# Patient Record
Sex: Female | Born: 1975
Health system: Southern US, Community
[De-identification: ages and names within clinical notes are randomized; demographics above are authoritative.]

---

## 2013-07-27 ENCOUNTER — Institutional Professional Consult (permissible substitution): Payer: Self-pay | Admitting: Sports Medicine

## 2013-08-01 ENCOUNTER — Institutional Professional Consult (permissible substitution): Payer: Self-pay | Admitting: Sports Medicine

## 2013-08-18 ENCOUNTER — Institutional Professional Consult (permissible substitution): Payer: Self-pay | Admitting: Sports Medicine

## 2015-01-18 ENCOUNTER — Telehealth: Payer: Self-pay

## 2015-01-18 NOTE — Telephone Encounter (Signed)
Opened in error

## 2015-06-19 ENCOUNTER — Ambulatory Visit (INDEPENDENT_AMBULATORY_CARE_PROVIDER_SITE_OTHER): Payer: BLUE CROSS/BLUE SHIELD | Admitting: Sports Medicine

## 2015-06-19 VITALS — BP 109/71 | HR 94 | Resp 18 | Wt 131.7 lb

## 2015-06-19 DIAGNOSIS — M5412 Radiculopathy, cervical region: Secondary | ICD-10-CM | POA: Diagnosis not present

## 2015-06-19 MED ORDER — ETODOLAC ER 600 MG PO TB24: 600.0000 mg | ORAL_TABLET | Freq: Every day | ORAL | Status: DC

## 2015-06-19 MED ORDER — PREDNISONE 50 MG PO TABS: ORAL_TABLET | ORAL | Status: DC

## 2015-06-19 MED ORDER — GABAPENTIN 300 MG PO CAPS: ORAL_CAPSULE | ORAL | Status: DC

## 2015-06-19 MED ORDER — MELOXICAM 15 MG PO TABS: ORAL_TABLET | ORAL | Status: DC

## 2015-06-19 NOTE — Progress Notes (Signed)
   Subjective:    I'm seeing this patient as a consultation for:  Dr. Tomma RakersBruce Lantelme  CC: Neck and shoulder pain  HPI: This is a pleasant 40 year old female, she comes in with a long history of neck, and upper shoulder pain, with radiation of pain to the fourth and fifth fingers. Symptoms are moderate, persistent, she has had a couple of MRI that shows small disc protrusions, has had formal physical therapy and epidural injections, the epidural injection with some time ago and she cannot remember whether it helped or not. No bowel or bladder dysfunction, no saddle numbness, minimal shoulder pain.  Past medical history, Surgical history, Family history not pertinant except as noted below, Social history, Allergies, and medications have been entered into the medical record, reviewed, and no changes needed.   Review of Systems: No headache, visual changes, nausea, vomiting, diarrhea, constipation, dizziness, abdominal pain, skin rash, fevers, chills, night sweats, weight loss, swollen lymph nodes, body aches, joint swelling, muscle aches, chest pain, shortness of breath, mood changes, visual or auditory hallucinations.   Objective:   General: Well Developed, well nourished, and in no acute distress.  Neuro/Psych: Alert and oriented x3, extra-ocular muscles intact, able to move all 4 extremities, sensation grossly intact. Skin: Warm and dry, no rashes noted.  Respiratory: Not using accessory muscles, speaking in full sentences, trachea midline.  Cardiovascular: Pulses palpable, no extremity edema. Abdomen: Does not appear distended. Right Shoulder: Inspection reveals no abnormalities, atrophy or asymmetry. Palpation is normal with no tenderness over AC joint or bicipital groove. ROM is full in all planes, there is some pain with passive range of motion. Rotator cuff strength normal throughout. Weakly positive Neer and Hawkin's tests, empty can. Speeds and Yergason's tests normal. No labral  pathology noted with negative Obrien's, negative crank, negative clunk, and good stability. Positive cross arm sign Normal scapular function observed. No painful arc and no drop arm sign. No apprehension sign Neck: Negative spurling's Full neck range of motion Grip strength and sensation normal in bilateral hands Strength good C4 to T1 distribution No sensory change to C4 to T1 Reflexes normal  Impression and Recommendations:   This case required medical decision making of moderate complexity.

## 2015-06-19 NOTE — Assessment & Plan Note (Signed)
MRI [facility] shows a small C5-C6 protrusion with indentation on the thecal sac, no clear central canal or foraminal stenosis. Has already been through physical therapy, epidural steroid injection, unsure of how she responded. Has not yet been on neuropathic agents. She is going to get me the MRI on disc, and we are going to start a steady up taper of gabapentin, also adding meloxicam. She is fairly painful with any movement of her shoulder, and I am concerned there is an element of myofascial pain syndrome as well.  Return to see me in one month.

## 2015-06-20 ENCOUNTER — Telehealth: Payer: Self-pay

## 2015-06-20 NOTE — Telephone Encounter (Signed)
It has only been a matter of hours since I have seen her, she needs to take all the prescribed medicines and give it at least a week before we change the plan, surgery would result in more pain initially, and an epidural wouldn't start working for 4-7 days anyway, so give the prescribed medicines a chance to work, we also discussed that she would need to drop off her MRI on a disc for me to review before considering injection.  Also, how has her mood been?

## 2015-06-20 NOTE — Telephone Encounter (Signed)
Michelle DikeJennifer called and states she is doing worse today. She wants to know if she could get the epidural or be referred to someone for surgery. Please advise.

## 2015-06-21 NOTE — Telephone Encounter (Signed)
Patient advised of recommendations.  

## 2015-06-27 ENCOUNTER — Ambulatory Visit (INDEPENDENT_AMBULATORY_CARE_PROVIDER_SITE_OTHER): Payer: BLUE CROSS/BLUE SHIELD | Admitting: Sports Medicine

## 2015-06-27 DIAGNOSIS — M5412 Radiculopathy, cervical region: Secondary | ICD-10-CM | POA: Diagnosis not present

## 2015-06-27 MED ORDER — TRAMADOL HCL 50 MG PO TABS: ORAL_TABLET | ORAL | Status: DC

## 2015-06-27 NOTE — Progress Notes (Signed)
  Subjective:    CC: follow-up  HPI: This is a pleasant 40 year old female with right C6 type radiculopathy, she has had a couple of cervical epidurals without tremendous response, more recently we added prednisone, gabapentin, Lodine. Unfortunately she has not responded much.  She did bring her MRI for review. She also has a nerve conduction study done at [facility] that does confirm a right C6 radiculopathy, as well as some evidence of a median neuropathy at the wrist. Patient has no wrist symptoms, all neck pain.  Past medical history, Surgical history, Family history not pertinant except as noted below, Social history, Allergies, and medications have been entered into the medical record, reviewed, and no changes needed.   Review of Systems: No fevers, chills, night sweats, weight loss, chest pain, or shortness of breath.   Objective:    General: Well Developed, well nourished, and in no acute distress.  Neuro: Alert and oriented x3, extra-ocular muscles intact, sensation grossly intact.  HEENT: Normocephalic, atraumatic, pupils equal round reactive to light, neck supple, no masses, no lymphadenopathy, thyroid nonpalpable.  Skin: Warm and dry, no rashes. Cardiac: Regular rate and rhythm, no murmurs rubs or gallops, no lower extremity edema.  Respiratory: Clear to auscultation bilaterally. Not using accessory muscles, speaking in full sentences.  Cervical spine MRI shows a non-desiccated disc protrusion at the C5-C6 level, it does come close to the thecal sac, and there is a mild right foraminal component, it doesn't clearly contact the C6 nerve root.  Impression and Recommendations:

## 2015-06-27 NOTE — Assessment & Plan Note (Signed)
Nerve conduction study confirms right C6 radiculopathy, there is some element of median neuropathy at the wrist but there are no wrist symptoms, predominant pain is in the neck. MRI personally reviewed and shows a C5-C6 disc protrusion that is fairly small, but has the potential for intermittent right C5-C6 foraminal stenosis. Currently on gabapentin 300 mg twice a day, continue up taper. We are going to proceed with another cervical epidural, I would recommend that we do epidurals #2 and 3 approximately one month apart before considering surgical intervention.  Tramadol for pain, and I would like her to also get a second opinion from our spine surgeon.

## 2015-07-02 ENCOUNTER — Encounter: Payer: Self-pay | Admitting: Sports Medicine

## 2015-07-08 ENCOUNTER — Ambulatory Visit: Payer: BLUE CROSS/BLUE SHIELD | Admitting: Sports Medicine

## 2015-12-18 ENCOUNTER — Ambulatory Visit (INDEPENDENT_AMBULATORY_CARE_PROVIDER_SITE_OTHER): Payer: BLUE CROSS/BLUE SHIELD

## 2015-12-18 ENCOUNTER — Ambulatory Visit (INDEPENDENT_AMBULATORY_CARE_PROVIDER_SITE_OTHER): Payer: BLUE CROSS/BLUE SHIELD | Admitting: Sports Medicine

## 2015-12-18 DIAGNOSIS — M5416 Radiculopathy, lumbar region: Secondary | ICD-10-CM

## 2015-12-18 DIAGNOSIS — M79604 Pain in right leg: Secondary | ICD-10-CM | POA: Diagnosis not present

## 2015-12-18 MED ORDER — PREDNISONE 50 MG PO TABS: ORAL_TABLET | ORAL | 0 refills | Status: DC

## 2015-12-18 NOTE — Assessment & Plan Note (Signed)
Prednisone, formal physical therapy, x-rays.  Return to see me in one month, MRI for interventional planning if no better.

## 2015-12-18 NOTE — Progress Notes (Signed)
   Subjective:    I'm seeing this patient as a consultation for:  Dr. Vickki MuffWeston Saunders/Dr. Tomma RakersBruce Lantelme  CC: Right leg pain  HPI: This is a pleasant 40 year old female with a history of cervical radiculopathy, improved with epidurals. Unfortunately she's had about 8 weeks of increasing pain running down her right posterior thigh, posterior calf to the outside of her ankle, but not to the toes. No positions make it worse, pain is moderate, persistent, no bowel or bladder dysfunction, saddle numbness, no constitutional symptoms.  Past medical history:  Negative.  See flowsheet/record as well for more information.  Surgical history: Negative.  See flowsheet/record as well for more information.  Family history: Negative.  See flowsheet/record as well for more information.  Social history: Negative.  See flowsheet/record as well for more information.  Allergies, and medications have been entered into the medical record, reviewed, and no changes needed.   Review of Systems: No headache, visual changes, nausea, vomiting, diarrhea, constipation, dizziness, abdominal pain, skin rash, fevers, chills, night sweats, weight loss, swollen lymph nodes, body aches, joint swelling, muscle aches, chest pain, shortness of breath, mood changes, visual or auditory hallucinations.   Objective:   General: Well Developed, well nourished, and in no acute distress.  Neuro/Psych: Alert and oriented x3, extra-ocular muscles intact, able to move all 4 extremities, sensation grossly intact. Skin: Warm and dry, no rashes noted.  Respiratory: Not using accessory muscles, speaking in full sentences, trachea midline.  Cardiovascular: Pulses palpable, no extremity edema. Abdomen: Does not appear distended. Back Exam:  Inspection: Unremarkable  Motion: Flexion 45 deg, Extension 45 deg, Side Bending to 45 deg bilaterally,  Rotation to 45 deg bilaterally  SLR laying: Negative  XSLR laying: Negative  Palpable tenderness:  None. FABER: negative. Sensory change: Gross sensation intact to all lumbar and sacral dermatomes.  Reflexes: 2+ at both patellar tendons, 2+ at achilles tendons, Babinski's downgoing.  Strength at foot  Plantar-flexion: 5/5 Dorsi-flexion: 5/5 Eversion: 5/5 Inversion: 5/5  Leg strength  Quad: 5/5 Hamstring: 5/5 Hip flexor: 5/5 Hip abductors: 5/5  Gait unremarkable.  Impression and Recommendations:   This case required medical decision making of moderate complexity.  Right lumbar radiculopathy Prednisone, formal physical therapy, x-rays.  Return to see me in one month, MRI for interventional planning if no better.

## 2016-01-01 DIAGNOSIS — E559 Vitamin D deficiency, unspecified: Secondary | ICD-10-CM | POA: Diagnosis not present

## 2016-01-01 DIAGNOSIS — R51 Headache: Secondary | ICD-10-CM | POA: Diagnosis not present

## 2016-01-01 DIAGNOSIS — A6920 Lyme disease, unspecified: Secondary | ICD-10-CM | POA: Diagnosis not present

## 2016-01-01 DIAGNOSIS — R29898 Other symptoms and signs involving the musculoskeletal system: Secondary | ICD-10-CM | POA: Diagnosis not present

## 2016-01-15 ENCOUNTER — Other Ambulatory Visit: Payer: Self-pay | Admitting: Sports Medicine

## 2016-01-15 DIAGNOSIS — M5412 Radiculopathy, cervical region: Secondary | ICD-10-CM

## 2016-01-16 DIAGNOSIS — R29898 Other symptoms and signs involving the musculoskeletal system: Secondary | ICD-10-CM | POA: Diagnosis not present

## 2016-01-16 DIAGNOSIS — A6920 Lyme disease, unspecified: Secondary | ICD-10-CM | POA: Diagnosis not present

## 2016-01-16 DIAGNOSIS — R5383 Other fatigue: Secondary | ICD-10-CM | POA: Diagnosis not present

## 2016-01-16 DIAGNOSIS — R51 Headache: Secondary | ICD-10-CM | POA: Diagnosis not present

## 2016-01-22 DIAGNOSIS — M4600 Spinal enthesopathy, site unspecified: Secondary | ICD-10-CM | POA: Diagnosis not present

## 2016-01-22 DIAGNOSIS — M999 Biomechanical lesion, unspecified: Secondary | ICD-10-CM | POA: Diagnosis not present

## 2016-01-29 DIAGNOSIS — M4600 Spinal enthesopathy, site unspecified: Secondary | ICD-10-CM | POA: Diagnosis not present

## 2016-01-29 DIAGNOSIS — M999 Biomechanical lesion, unspecified: Secondary | ICD-10-CM | POA: Diagnosis not present

## 2016-01-29 DIAGNOSIS — H524 Presbyopia: Secondary | ICD-10-CM | POA: Diagnosis not present

## 2016-02-04 DIAGNOSIS — M4600 Spinal enthesopathy, site unspecified: Secondary | ICD-10-CM | POA: Diagnosis not present

## 2016-02-04 DIAGNOSIS — M9902 Segmental and somatic dysfunction of thoracic region: Secondary | ICD-10-CM | POA: Diagnosis not present

## 2016-02-04 DIAGNOSIS — M999 Biomechanical lesion, unspecified: Secondary | ICD-10-CM | POA: Diagnosis not present

## 2016-02-28 DIAGNOSIS — R922 Inconclusive mammogram: Secondary | ICD-10-CM | POA: Diagnosis not present

## 2016-02-28 DIAGNOSIS — Z9189 Other specified personal risk factors, not elsewhere classified: Secondary | ICD-10-CM | POA: Diagnosis not present

## 2016-02-28 DIAGNOSIS — Z1371 Encounter for nonprocreative screening for genetic disease carrier status: Secondary | ICD-10-CM | POA: Diagnosis not present

## 2016-02-28 DIAGNOSIS — Z1231 Encounter for screening mammogram for malignant neoplasm of breast: Secondary | ICD-10-CM | POA: Diagnosis not present

## 2016-03-11 DIAGNOSIS — Z01411 Encounter for gynecological examination (general) (routine) with abnormal findings: Secondary | ICD-10-CM | POA: Diagnosis not present

## 2016-03-11 DIAGNOSIS — R635 Abnormal weight gain: Secondary | ICD-10-CM | POA: Diagnosis not present

## 2016-03-11 DIAGNOSIS — N951 Menopausal and female climacteric states: Secondary | ICD-10-CM | POA: Diagnosis not present

## 2016-03-30 DIAGNOSIS — M549 Dorsalgia, unspecified: Secondary | ICD-10-CM | POA: Diagnosis not present

## 2016-03-30 DIAGNOSIS — R3915 Urgency of urination: Secondary | ICD-10-CM | POA: Diagnosis not present

## 2016-04-15 DIAGNOSIS — G8190 Hemiplegia, unspecified affecting unspecified side: Secondary | ICD-10-CM | POA: Diagnosis not present

## 2016-04-15 DIAGNOSIS — A6920 Lyme disease, unspecified: Secondary | ICD-10-CM | POA: Diagnosis not present

## 2016-04-15 DIAGNOSIS — E559 Vitamin D deficiency, unspecified: Secondary | ICD-10-CM | POA: Diagnosis not present

## 2016-04-15 DIAGNOSIS — G8929 Other chronic pain: Secondary | ICD-10-CM | POA: Diagnosis not present

## 2016-04-27 DIAGNOSIS — A6920 Lyme disease, unspecified: Secondary | ICD-10-CM | POA: Diagnosis not present

## 2016-04-27 DIAGNOSIS — E2749 Other adrenocortical insufficiency: Secondary | ICD-10-CM | POA: Diagnosis not present

## 2016-04-27 DIAGNOSIS — R7982 Elevated C-reactive protein (CRP): Secondary | ICD-10-CM | POA: Diagnosis not present

## 2016-04-27 DIAGNOSIS — R5382 Chronic fatigue, unspecified: Secondary | ICD-10-CM | POA: Diagnosis not present

## 2016-05-15 DIAGNOSIS — G8929 Other chronic pain: Secondary | ICD-10-CM | POA: Diagnosis not present

## 2016-05-15 DIAGNOSIS — M797 Fibromyalgia: Secondary | ICD-10-CM | POA: Diagnosis not present

## 2016-06-17 DIAGNOSIS — G894 Chronic pain syndrome: Secondary | ICD-10-CM | POA: Diagnosis not present

## 2016-06-17 DIAGNOSIS — E2749 Other adrenocortical insufficiency: Secondary | ICD-10-CM | POA: Diagnosis not present

## 2016-06-17 DIAGNOSIS — E7212 Methylenetetrahydrofolate reductase deficiency: Secondary | ICD-10-CM | POA: Diagnosis not present

## 2016-07-02 DIAGNOSIS — Z77120 Contact with and (suspected) exposure to mold (toxic): Secondary | ICD-10-CM | POA: Diagnosis not present

## 2016-07-02 DIAGNOSIS — G8929 Other chronic pain: Secondary | ICD-10-CM | POA: Diagnosis not present

## 2016-07-02 DIAGNOSIS — E7212 Methylenetetrahydrofolate reductase deficiency: Secondary | ICD-10-CM | POA: Diagnosis not present

## 2016-07-02 DIAGNOSIS — G629 Polyneuropathy, unspecified: Secondary | ICD-10-CM | POA: Diagnosis not present

## 2016-07-29 DIAGNOSIS — G8190 Hemiplegia, unspecified affecting unspecified side: Secondary | ICD-10-CM | POA: Diagnosis not present

## 2016-07-29 DIAGNOSIS — Z77120 Contact with and (suspected) exposure to mold (toxic): Secondary | ICD-10-CM | POA: Diagnosis not present

## 2016-09-01 DIAGNOSIS — R922 Inconclusive mammogram: Secondary | ICD-10-CM | POA: Diagnosis not present

## 2016-09-01 DIAGNOSIS — Z9189 Other specified personal risk factors, not elsewhere classified: Secondary | ICD-10-CM | POA: Diagnosis not present

## 2016-09-01 DIAGNOSIS — Z1371 Encounter for nonprocreative screening for genetic disease carrier status: Secondary | ICD-10-CM | POA: Diagnosis not present

## 2016-09-01 DIAGNOSIS — E8940 Asymptomatic postprocedural ovarian failure: Secondary | ICD-10-CM | POA: Diagnosis not present

## 2016-09-03 DIAGNOSIS — Z77120 Contact with and (suspected) exposure to mold (toxic): Secondary | ICD-10-CM | POA: Diagnosis not present

## 2016-09-03 DIAGNOSIS — G894 Chronic pain syndrome: Secondary | ICD-10-CM | POA: Diagnosis not present

## 2016-11-04 DIAGNOSIS — Z803 Family history of malignant neoplasm of breast: Secondary | ICD-10-CM | POA: Diagnosis not present

## 2016-11-04 DIAGNOSIS — N6012 Diffuse cystic mastopathy of left breast: Secondary | ICD-10-CM | POA: Diagnosis not present

## 2016-11-04 DIAGNOSIS — E8940 Asymptomatic postprocedural ovarian failure: Secondary | ICD-10-CM | POA: Diagnosis not present

## 2016-11-04 DIAGNOSIS — R922 Inconclusive mammogram: Secondary | ICD-10-CM | POA: Diagnosis not present

## 2016-11-04 DIAGNOSIS — Z90722 Acquired absence of ovaries, bilateral: Secondary | ICD-10-CM | POA: Diagnosis not present

## 2016-11-04 DIAGNOSIS — N6011 Diffuse cystic mastopathy of right breast: Secondary | ICD-10-CM | POA: Diagnosis not present

## 2016-12-03 DIAGNOSIS — G8190 Hemiplegia, unspecified affecting unspecified side: Secondary | ICD-10-CM | POA: Diagnosis not present

## 2016-12-03 DIAGNOSIS — Z77120 Contact with and (suspected) exposure to mold (toxic): Secondary | ICD-10-CM | POA: Diagnosis not present

## 2016-12-03 DIAGNOSIS — R5383 Other fatigue: Secondary | ICD-10-CM | POA: Diagnosis not present

## 2016-12-03 DIAGNOSIS — A6920 Lyme disease, unspecified: Secondary | ICD-10-CM | POA: Diagnosis not present

## 2017-01-11 DIAGNOSIS — A6920 Lyme disease, unspecified: Secondary | ICD-10-CM | POA: Diagnosis not present

## 2017-01-11 DIAGNOSIS — G894 Chronic pain syndrome: Secondary | ICD-10-CM | POA: Diagnosis not present

## 2017-01-11 DIAGNOSIS — R5381 Other malaise: Secondary | ICD-10-CM | POA: Diagnosis not present

## 2017-01-11 DIAGNOSIS — G8190 Hemiplegia, unspecified affecting unspecified side: Secondary | ICD-10-CM | POA: Diagnosis not present

## 2017-05-03 DIAGNOSIS — Z1231 Encounter for screening mammogram for malignant neoplasm of breast: Secondary | ICD-10-CM | POA: Diagnosis not present

## 2017-05-04 DIAGNOSIS — Z77120 Contact with and (suspected) exposure to mold (toxic): Secondary | ICD-10-CM | POA: Diagnosis not present

## 2017-05-04 DIAGNOSIS — N951 Menopausal and female climacteric states: Secondary | ICD-10-CM | POA: Diagnosis not present

## 2017-05-04 DIAGNOSIS — A6920 Lyme disease, unspecified: Secondary | ICD-10-CM | POA: Diagnosis not present

## 2017-05-04 DIAGNOSIS — G8190 Hemiplegia, unspecified affecting unspecified side: Secondary | ICD-10-CM | POA: Diagnosis not present

## 2017-05-11 DIAGNOSIS — Z1371 Encounter for nonprocreative screening for genetic disease carrier status: Secondary | ICD-10-CM | POA: Diagnosis not present

## 2017-05-11 DIAGNOSIS — R928 Other abnormal and inconclusive findings on diagnostic imaging of breast: Secondary | ICD-10-CM | POA: Diagnosis not present

## 2017-05-11 DIAGNOSIS — N6012 Diffuse cystic mastopathy of left breast: Secondary | ICD-10-CM | POA: Diagnosis not present

## 2017-05-11 DIAGNOSIS — Z9189 Other specified personal risk factors, not elsewhere classified: Secondary | ICD-10-CM | POA: Diagnosis not present

## 2017-05-11 DIAGNOSIS — R922 Inconclusive mammogram: Secondary | ICD-10-CM | POA: Diagnosis not present

## 2017-05-18 DIAGNOSIS — H5112 Convergence excess: Secondary | ICD-10-CM | POA: Diagnosis not present

## 2017-05-27 ENCOUNTER — Encounter: Payer: Self-pay | Admitting: Sports Medicine

## 2017-05-27 ENCOUNTER — Ambulatory Visit (INDEPENDENT_AMBULATORY_CARE_PROVIDER_SITE_OTHER): Payer: BLUE CROSS/BLUE SHIELD | Admitting: Sports Medicine

## 2017-05-27 DIAGNOSIS — M7711 Lateral epicondylitis, right elbow: Secondary | ICD-10-CM

## 2017-05-27 NOTE — Progress Notes (Signed)
Subjective:    I'm seeing this patient as a consultation for: Dr. Tomma Rakers  CC: Right elbow pain  HPI: This is a pleasant 42 year old female, for the past several months she has had increasing pain over the lateral aspect of her right elbow, severe, persistent, worse with gripping, opening doors, shaking hands, localized over the common extensor tendon origin with radiation over to the dorsal mid forearm.  She is also having some right arm radicular and right leg radicular symptoms that she would like to keep on the back burner for now.  We did order a cervical epidural, she never proceeded with this, we also had discussed her lumbar radiculopathy in 2018 but she never followed up.  I reviewed the past medical history, family history, social history, surgical history, and allergies today and no changes were needed.  Please see the problem list section below in epic for further details.  Past Medical History: History reviewed. No pertinent past medical history. Past Surgical History: History reviewed. No pertinent surgical history. Social History: Social History   Socioeconomic History  . Marital status: Married    Spouse name: None  . Number of children: None  . Years of education: None  . Highest education level: None  Social Needs  . Financial resource strain: None  . Food insecurity - worry: None  . Food insecurity - inability: None  . Transportation needs - medical: None  . Transportation needs - non-medical: None  Occupational History  . None  Tobacco Use  . Smoking status: Never Smoker  . Smokeless tobacco: Never Used  Substance and Sexual Activity  . Alcohol use: None  . Drug use: None  . Sexual activity: None  Other Topics Concern  . None  Social History Narrative  . None   Family History: History reviewed. No pertinent family history. Allergies: No Known Allergies Medications: See med rec.  Review of Systems: No headache, visual changes, nausea,  vomiting, diarrhea, constipation, dizziness, abdominal pain, skin rash, fevers, chills, night sweats, weight loss, swollen lymph nodes, body aches, joint swelling, muscle aches, chest pain, shortness of breath, mood changes, visual or auditory hallucinations.   Objective:   General: Well Developed, well nourished, and in no acute distress.  Neuro:  Extra-ocular muscles intact, able to move all 4 extremities, sensation grossly intact.  Deep tendon reflexes tested were normal. Psych: Alert and oriented, mood congruent with affect. ENT:  Ears and nose appear unremarkable.  Hearing grossly normal. Neck: Unremarkable overall appearance, trachea midline.  No visible thyroid enlargement. Eyes: Conjunctivae and lids appear unremarkable.  Pupils equal and round. Skin: Warm and dry, no rashes noted.  Cardiovascular: Pulses palpable, no extremity edema. Right elbow: Unremarkable to inspection. Range of motion full pronation, supination, flexion, extension. Strength is full to all of the above directions Stable to varus, valgus stress. Negative moving valgus stress test. Tender to palpation of the common extensor tendon origin Ulnar nerve does not sublux. Negative cubital tunnel Tinel's.  Procedure: Real-time Ultrasound Guided Injection of right common extensor tendon origin Device: GE Logiq E  Verbal informed consent obtained.  Time-out conducted.  Noted no overlying erythema, induration, or other signs of local infection.  Skin prepped in a sterile fashion.  Local anesthesia: Topical Ethyl chloride.  With sterile technique and under real time ultrasound guidance: Noted hypoechoic gap in the deep origin of the common extensor tendon at the lateral epicondyle, I advanced a 25-gauge needle and injected medication both superficial to, deep to the extensor  tendon, I placed a touch of medication into the hypoechoic gap as well, a total of 1 cc Kenalog 40, 1 cc lidocaine, 1 cc bupivacaine. Completed  without difficulty  Pain immediately resolved suggesting accurate placement of the medication.  Advised to call if fevers/chills, erythema, induration, drainage, or persistent bleeding.  Images permanently stored and available for review in the ultrasound unit.  Impression: Technically successful ultrasound guided injection.  Impression and Recommendations:   This case required medical decision making of moderate complexity.  Lateral epicondylitis, right elbow Per patient request injection as above. Patient will obtain tennis elbow brace on her own. Rehab exercises given, we are putting her back and neck on the back burner for now. Return in 1 month to evaluate response. ___________________________________________ Ihor Austinhomas J. Benjamin Stainhekkekandam, M.D., ABFM., CAQSM. Primary Care and Sports Medicine [facility]  Adjunct Instructor of Family Medicine  [employer]

## 2017-05-27 NOTE — Assessment & Plan Note (Signed)
Per patient request injection as above. Patient will obtain tennis elbow brace on her own. Rehab exercises given, we are putting her back and neck on the back burner for now. Return in 1 month to evaluate response.

## 2017-06-04 DIAGNOSIS — H518 Other specified disorders of binocular movement: Secondary | ICD-10-CM | POA: Diagnosis not present

## 2017-06-04 DIAGNOSIS — R200 Anesthesia of skin: Secondary | ICD-10-CM | POA: Diagnosis not present

## 2017-06-04 DIAGNOSIS — R51 Headache: Secondary | ICD-10-CM | POA: Diagnosis not present

## 2017-06-04 DIAGNOSIS — R202 Paresthesia of skin: Secondary | ICD-10-CM | POA: Diagnosis not present

## 2017-06-08 DIAGNOSIS — R42 Dizziness and giddiness: Secondary | ICD-10-CM | POA: Diagnosis not present

## 2017-06-08 DIAGNOSIS — H5021 Vertical strabismus, right eye: Secondary | ICD-10-CM | POA: Diagnosis not present

## 2017-06-11 DIAGNOSIS — R51 Headache: Secondary | ICD-10-CM | POA: Diagnosis not present

## 2017-06-11 DIAGNOSIS — H538 Other visual disturbances: Secondary | ICD-10-CM | POA: Diagnosis not present

## 2017-06-21 DIAGNOSIS — R200 Anesthesia of skin: Secondary | ICD-10-CM | POA: Diagnosis not present

## 2017-06-21 DIAGNOSIS — R51 Headache: Secondary | ICD-10-CM | POA: Diagnosis not present

## 2017-06-21 DIAGNOSIS — R202 Paresthesia of skin: Secondary | ICD-10-CM | POA: Diagnosis not present

## 2017-08-11 DIAGNOSIS — R51 Headache: Secondary | ICD-10-CM | POA: Diagnosis not present

## 2017-09-02 DIAGNOSIS — Z9189 Other specified personal risk factors, not elsewhere classified: Secondary | ICD-10-CM | POA: Diagnosis not present

## 2017-09-02 DIAGNOSIS — E8940 Asymptomatic postprocedural ovarian failure: Secondary | ICD-10-CM | POA: Diagnosis not present

## 2017-09-02 DIAGNOSIS — Z1371 Encounter for nonprocreative screening for genetic disease carrier status: Secondary | ICD-10-CM | POA: Diagnosis not present

## 2017-09-02 DIAGNOSIS — R922 Inconclusive mammogram: Secondary | ICD-10-CM | POA: Diagnosis not present

## 2017-09-06 DIAGNOSIS — G4700 Insomnia, unspecified: Secondary | ICD-10-CM | POA: Diagnosis not present

## 2017-09-06 DIAGNOSIS — Z9189 Other specified personal risk factors, not elsewhere classified: Secondary | ICD-10-CM | POA: Diagnosis not present

## 2017-09-06 DIAGNOSIS — F4322 Adjustment disorder with anxiety: Secondary | ICD-10-CM | POA: Diagnosis not present

## 2017-09-06 DIAGNOSIS — R232 Flushing: Secondary | ICD-10-CM | POA: Diagnosis not present

## 2017-09-13 DIAGNOSIS — M8588 Other specified disorders of bone density and structure, other site: Secondary | ICD-10-CM | POA: Diagnosis not present

## 2017-09-13 DIAGNOSIS — E2839 Other primary ovarian failure: Secondary | ICD-10-CM | POA: Diagnosis not present

## 2017-09-13 DIAGNOSIS — E8940 Asymptomatic postprocedural ovarian failure: Secondary | ICD-10-CM | POA: Diagnosis not present

## 2017-09-13 DIAGNOSIS — M8589 Other specified disorders of bone density and structure, multiple sites: Secondary | ICD-10-CM | POA: Diagnosis not present

## 2017-09-13 DIAGNOSIS — M85851 Other specified disorders of bone density and structure, right thigh: Secondary | ICD-10-CM | POA: Diagnosis not present

## 2017-09-30 ENCOUNTER — Ambulatory Visit: Payer: BLUE CROSS/BLUE SHIELD | Admitting: Sports Medicine

## 2017-09-30 ENCOUNTER — Encounter: Payer: Self-pay | Admitting: Sports Medicine

## 2017-09-30 DIAGNOSIS — M7711 Lateral epicondylitis, right elbow: Secondary | ICD-10-CM

## 2017-09-30 DIAGNOSIS — M2550 Pain in unspecified joint: Secondary | ICD-10-CM | POA: Diagnosis not present

## 2017-09-30 NOTE — Assessment & Plan Note (Signed)
Right common extensor tendon injection 4 months ago, now just recently having a recurrence of symptoms. Did not get any efficacy with a counterforce brace. Repeat injection today with ultrasound guidance, she will be very diligent with her rehab exercises. If recurrence of symptoms before 3 to 4 months we will proceed with a PRP percutaneous tenotomy.

## 2017-09-30 NOTE — Assessment & Plan Note (Addendum)
Patient did notice a good improvement in widespread polyarthralgias and myalgias after the steroid injection from the systemic effect. We are going to do a full rheumatoid work-up. CBC, ESR, CMP, rheumatoid panel, lupus panel, uric acid, CK levels. She has noticed improvement in some of her disconjugate gaze symptoms and joint aches with nortriptyline but has noted weight gain as is common with tricyclic antidepressants.

## 2017-09-30 NOTE — Progress Notes (Signed)
Subjective:    CC: Right elbow pain  HPI: Michelle Lewis is a pleasant 42 year old female, 4 months ago we did a common extensor tendon injection on her right elbow, she had good relief until recently.  She is back with pain over the lateral epicondyles on the right, moderate, persistent without radiation, worse with gripping motions.  She has seen a neurologist in the meantime for disconjugate gaze, was referred to neuro ophthalmology, has had a full work-up including lumbar puncture with no abnormal bands, brain MRI.  She did report that her multiple polyarthralgias and myalgias were resolved for a short period of time after the steroid injection.  She has not yet had a full rheumatoid work-up.  I reviewed the past medical history, family history, social history, surgical history, and allergies today and no changes were needed.  Please see the problem list section below in epic for further details.  Past Medical History: No past medical history on file. Past Surgical History: No past surgical history on file. Social History: Social History   Socioeconomic History  . Marital status: Married    Spouse name: Not on file  . Number of children: Not on file  . Years of education: Not on file  . Highest education level: Not on file  Occupational History  . Not on file  Social Needs  . Financial resource strain: Not on file  . Food insecurity:    Worry: Not on file    Inability: Not on file  . Transportation needs:    Medical: Not on file    Non-medical: Not on file  Tobacco Use  . Smoking status: Never Smoker  . Smokeless tobacco: Never Used  Substance and Sexual Activity  . Alcohol use: Not on file  . Drug use: Not on file  . Sexual activity: Not on file  Lifestyle  . Physical activity:    Days per week: Not on file    Minutes per session: Not on file  . Stress: Not on file  Relationships  . Social connections:    Talks on phone: Not on file    Gets together: Not on file   Attends religious service: Not on file    Active member of club or organization: Not on file    Attends meetings of clubs or organizations: Not on file    Relationship status: Not on file  Other Topics Concern  . Not on file  Social History Narrative  . Not on file   Family History: No family history on file. Allergies: No Known Allergies Medications: See med rec.  Review of Systems: No fevers, chills, night sweats, weight loss, chest pain, or shortness of breath.   Objective:    General: Well Developed, well nourished, and in no acute distress.  Neuro: Alert and oriented x3, extra-ocular muscles intact, sensation grossly intact.  HEENT: Normocephalic, atraumatic, pupils equal round reactive to light, neck supple, no masses, no lymphadenopathy, thyroid nonpalpable.  Skin: Warm and dry, no rashes. Cardiac: Regular rate and rhythm, no murmurs rubs or gallops, no lower extremity edema.  Respiratory: Clear to auscultation bilaterally. Not using accessory muscles, speaking in full sentences. Right elbow: Unremarkable to inspection. Range of motion full pronation, supination, flexion, extension. Strength is full to all of the above directions Stable to varus, valgus stress. Negative moving valgus stress test. Tender over the lateral epicondyle. Ulnar nerve does not sublux. Negative cubital tunnel Tinel's.  Procedure: Real-time Ultrasound Guided Injection of right common extensor tendon origin Device: GE  Logiq E  Verbal informed consent obtained.  Time-out conducted.  Noted no overlying erythema, induration, or other signs of local infection.  Skin prepped in a sterile fashion.  Local anesthesia: Topical Ethyl chloride.  With sterile technique and under real time ultrasound guidance: 1 cc, 40, 1 cc lidocaine, 1 cc bupivacaine injected easily Completed without difficulty  Pain immediately resolved suggesting accurate placement of the medication.  Advised to call if fevers/chills,  erythema, induration, drainage, or persistent bleeding.  Images permanently stored and available for review in the ultrasound unit.  Impression: Technically successful ultrasound guided injection.  Impression and Recommendations:    Lateral epicondylitis, right elbow Right common extensor tendon injection 4 months ago, now just recently having a recurrence of symptoms. Did not get any efficacy with a counterforce brace. Repeat injection today with ultrasound guidance, she will be very diligent with her rehab exercises. If recurrence of symptoms before 3 to 4 months we will proceed with a PRP percutaneous tenotomy.  Polyarthralgia Patient did notice a good improvement in widespread polyarthralgias and myalgias after the steroid injection from the systemic effect. We are going to do a full rheumatoid work-up. CBC, ESR, CMP, rheumatoid panel, lupus panel, uric acid, CK levels. She has noticed improvement in some of her disconjugate gaze symptoms and joint aches with nortriptyline but has noted weight gain as is common with tricyclic antidepressants. ___________________________________________ Gwen Her. Dianah Field, M.D., ABFM., CAQSM. Primary Care and [place] Instructor of [place] [employer] [facility] [employer]

## 2017-10-06 LAB — COMPREHENSIVE METABOLIC PANEL
AG Ratio: 1.9 (calc) (ref 1.0–2.5)
ALT: 28 U/L (ref 6–29)
AST: 23 U/L (ref 10–30)
Alkaline phosphatase (APISO): 67 U/L (ref 33–115)
BUN: 17 mg/dL (ref 7–25)
Calcium: 9.8 mg/dL (ref 8.6–10.2)
Total Bilirubin: 0.5 mg/dL (ref 0.2–1.2)

## 2017-10-06 LAB — COMPREHENSIVE METABOLIC PANEL WITH GFR
Albumin: 4.6 g/dL (ref 3.6–5.1)
CO2: 30 mmol/L (ref 20–32)
Chloride: 105 mmol/L (ref 98–110)
Creat: 0.71 mg/dL (ref 0.50–1.10)
Globulin: 2.4 g/dL (ref 1.9–3.7)
Glucose, Bld: 69 mg/dL (ref 65–139)
Potassium: 4.2 mmol/L (ref 3.5–5.3)
Sodium: 143 mmol/L (ref 135–146)
Total Protein: 7 g/dL (ref 6.1–8.1)

## 2017-10-06 LAB — LUPUS(12) PANEL
Anti Nuclear Antibody(ANA): NEGATIVE
C3 Complement: 131 mg/dL (ref 83–193)
C4 Complement: 28 mg/dL (ref 15–57)
ENA SM Ab Ser-aCnc: <1 AI
Rheumatoid fact SerPl-aCnc: <14 [IU]/mL (ref <14)
Ribosomal P Protein Ab: <1 AI
SM/RNP: <1 AI
SSA (Ro) (ENA) Antibody, IgG: <1 AI
SSB (La) (ENA) Antibody, IgG: <1 AI
Scleroderma (Scl-70) (ENA) Antibody, IgG: <1 AI
Thyroperoxidase Ab SerPl-aCnc: 1 IU/mL (ref <9)
ds DNA Ab: 1 [IU]/mL

## 2017-10-06 LAB — CBC WITH DIFFERENTIAL/PLATELET
Basophils Absolute: 102 cells/uL (ref 0–200)
Basophils Relative: 1.7 %
Eosinophils Absolute: 330 {cells}/uL (ref 15–500)
Eosinophils Relative: 5.5 %
HCT: 42.9 % (ref 35.0–45.0)
Hemoglobin: 13.9 g/dL (ref 11.7–15.5)
Lymphs Abs: 1464 cells/uL (ref 850–3900)
MCH: 29.4 pg (ref 27.0–33.0)
MCHC: 32.4 g/dL (ref 32.0–36.0)
MCV: 90.7 fL (ref 80.0–100.0)
MPV: 10.7 fL (ref 7.5–12.5)
Monocytes Relative: 9.2 %
Neutro Abs: 3552 cells/uL (ref 1500–7800)
Neutrophils Relative %: 59.2 %
Platelets: 277 Thousand/uL (ref 140–400)
RBC: 4.73 Million/uL (ref 3.80–5.10)
RDW: 12.7 % (ref 11.0–15.0)
Total Lymphocyte: 24.4 %
WBC mixed population: 552 cells/uL (ref 200–950)
WBC: 6 Thousand/uL (ref 3.8–10.8)

## 2017-10-06 LAB — RHEUMATOID FACTOR (IGA, IGG, IGM)
Rheumatoid Factor (IgA): <5 U (ref <=6)
Rheumatoid Factor (IgG): <5 U (ref <=6)
Rheumatoid Factor (IgM): <5 U (ref <=6)

## 2017-10-06 LAB — SEDIMENTATION RATE: Sed Rate: 6 mm/h (ref 0–20)

## 2017-10-06 LAB — URIC ACID: Uric Acid, Serum: 5 mg/dL (ref 2.5–7.0)

## 2017-10-06 LAB — CK: Total CK: 87 U/L (ref 29–143)

## 2017-10-06 LAB — CYCLIC CITRUL PEPTIDE ANTIBODY, IGG: Cyclic Citrullin Peptide Ab: <16 U

## 2017-10-13 DIAGNOSIS — Z79899 Other long term (current) drug therapy: Secondary | ICD-10-CM | POA: Diagnosis not present

## 2017-10-13 DIAGNOSIS — Z780 Asymptomatic menopausal state: Secondary | ICD-10-CM | POA: Diagnosis not present

## 2017-10-13 DIAGNOSIS — E28319 Asymptomatic premature menopause: Secondary | ICD-10-CM | POA: Diagnosis not present

## 2017-10-13 DIAGNOSIS — M8589 Other specified disorders of bone density and structure, multiple sites: Secondary | ICD-10-CM | POA: Diagnosis not present

## 2017-10-13 DIAGNOSIS — Z1321 Encounter for screening for nutritional disorder: Secondary | ICD-10-CM | POA: Diagnosis not present

## 2017-11-05 DIAGNOSIS — R928 Other abnormal and inconclusive findings on diagnostic imaging of breast: Secondary | ICD-10-CM | POA: Diagnosis not present

## 2017-11-05 DIAGNOSIS — N6002 Solitary cyst of left breast: Secondary | ICD-10-CM | POA: Diagnosis not present

## 2017-11-05 DIAGNOSIS — Z9189 Other specified personal risk factors, not elsewhere classified: Secondary | ICD-10-CM | POA: Diagnosis not present

## 2017-11-05 DIAGNOSIS — R922 Inconclusive mammogram: Secondary | ICD-10-CM | POA: Diagnosis not present

## 2017-12-14 ENCOUNTER — Ambulatory Visit: Payer: BLUE CROSS/BLUE SHIELD | Admitting: Sports Medicine

## 2017-12-14 DIAGNOSIS — M7711 Lateral epicondylitis, right elbow: Secondary | ICD-10-CM | POA: Diagnosis not present

## 2017-12-14 MED ORDER — DIAZEPAM 5 MG PO TABS: ORAL_TABLET | ORAL | 0 refills | Status: DC

## 2017-12-14 MED ORDER — HYDROCODONE-ACETAMINOPHEN 5-325 MG PO TABS: 1.0000 | ORAL_TABLET | Freq: Three times a day (TID) | ORAL | 0 refills | Status: DC | PRN

## 2017-12-14 NOTE — Progress Notes (Signed)
Subjective:    CC: Elbow pain  HPI: Michelle Lewis is a pleasant 42 year old female, she has had chronic right lateral epicondylitis, she responded well to initial steroid injection but subsequent injection done 2 months ago did not provide but a couple of weeks of relief.  She has been diligent with her exercises, pain is moderate, persistent, localized without radiation.  I reviewed the past medical history, family history, social history, surgical history, and allergies today and no changes were needed.  Please see the problem list section below in epic for further details.  Past Medical History: No past medical history on file. Past Surgical History: No past surgical history on file. Social History: Social History   Socioeconomic History  . Marital status: Married    Spouse name: Not on file  . Number of children: Not on file  . Years of education: Not on file  . Highest education level: Not on file  Occupational History  . Not on file  Social Needs  . Financial resource strain: Not on file  . Food insecurity:    Worry: Not on file    Inability: Not on file  . Transportation needs:    Medical: Not on file    Non-medical: Not on file  Tobacco Use  . Smoking status: Never Smoker  . Smokeless tobacco: Never Used  Substance and Sexual Activity  . Alcohol use: Not on file  . Drug use: Not on file  . Sexual activity: Not on file  Lifestyle  . Physical activity:    Days per week: Not on file    Minutes per session: Not on file  . Stress: Not on file  Relationships  . Social connections:    Talks on phone: Not on file    Gets together: Not on file    Attends religious service: Not on file    Active member of club or organization: Not on file    Attends meetings of clubs or organizations: Not on file    Relationship status: Not on file  Other Topics Concern  . Not on file  Social History Narrative  . Not on file   Family History: No family history on  file. Allergies: No Known Allergies Medications: See med rec.  Review of Systems: No fevers, chills, night sweats, weight loss, chest pain, or shortness of breath.   Objective:    General: Well Developed, well nourished, and in no acute distress.  Neuro: Alert and oriented x3, extra-ocular muscles intact, sensation grossly intact.  HEENT: Normocephalic, atraumatic, pupils equal round reactive to light, neck supple, no masses, no lymphadenopathy, thyroid nonpalpable.  Skin: Warm and dry, no rashes. Cardiac: Regular rate and rhythm, no murmurs rubs or gallops, no lower extremity edema.  Respiratory: Clear to auscultation bilaterally. Not using accessory muscles, speaking in full sentences. Right elbow: Unremarkable to inspection. Range of motion full pronation, supination, flexion, extension. Strength is full to all of the above directions Stable to varus, valgus stress. Negative moving valgus stress test. Tenderness at the common extensor tendon origin with reduction of pain with resisted extension of the middle finger. Ulnar nerve does not sublux. Negative cubital tunnel Tinel's.  Impression and Recommendations:    Lateral epicondylitis, right elbow At this point we have done to right common extensor tendon origin steroid injections, partial efficacy with the first 1, did not really get but a few weeks after the second injection 2 months ago. She has been doing her rehab exercises diligently. At this point we  are going to proceed with PRP percutaneous tenotomy. She will make an appointment to do PRP next week, adding some Valium for preprocedural anxiolysis, patient declines preoperative MRI. I discussed the mechanism of PRP injections.  ___________________________________________ Ihor Austinhomas J. Benjamin Stainhekkekandam, M.D., ABFM., CAQSM. Primary Care and Sports Medicine [facility]  Adjunct Instructor of Family Medicine  [employer]

## 2017-12-14 NOTE — Assessment & Plan Note (Signed)
At this point we have done to right common extensor tendon origin steroid injections, partial efficacy with the first 1, did not really get but a few weeks after the second injection 2 months ago. She has been doing her rehab exercises diligently. At this point we are going to proceed with PRP percutaneous tenotomy. She will make an appointment to do PRP next week, adding some Valium for preprocedural anxiolysis, patient declines preoperative MRI. I discussed the mechanism of PRP injections.

## 2017-12-20 ENCOUNTER — Ambulatory Visit: Payer: BLUE CROSS/BLUE SHIELD | Admitting: Sports Medicine

## 2017-12-20 DIAGNOSIS — M7711 Lateral epicondylitis, right elbow: Secondary | ICD-10-CM

## 2017-12-20 MED ORDER — HYDROCODONE-ACETAMINOPHEN 5-325 MG PO TABS: 1.0000 | ORAL_TABLET | Freq: Three times a day (TID) | ORAL | 0 refills | Status: DC | PRN

## 2017-12-20 NOTE — Progress Notes (Signed)
   Procedure: Real-time Ultrasound Guided Platelet Rich Plasma (PRP) Injection of right common extensor tendon origin Device: GE Logiq E  Verbal informed consent obtained.  Time-out conducted.  Noted no overlying erythema, induration, or other signs of local infection.  Obtained 30 cc of blood from peripheral vein, using the "PEAK" centrifuge, red blood cells were separated from the plasma. Subsequently red blood cells were drained leaving only plasma with the buffy coat layer between the desired lines. Platelet poor plasma was then centrifuged out, and remaining platelet rich plasma aspirated into a 5 cc syringe.  Skin prepped in a sterile fashion.  Local anesthesia: Topical Ethyl chloride.  With sterile technique and under real time ultrasound guidance the platelet rich plasma (PRP) obtained above: I made several passes through the common extensor tendon after injection of anesthetic, approximately 50 passes for a full percutaneous tenotomy with platelet rich plasma. Completed without difficulty  Advised to call if fevers/chills, erythema, induration, drainage, or persistent bleeding.  Images permanently stored and available for review in the ultrasound unit.  Impression: Technically successful ultrasound guided Platelet Rich Plasma (PRP) injection.  ___________________________________________________________________________________________  Lateral epicondylitis, right elbow After #2 right common extensor tendon origin steroid injections with partial efficacy with the first and only a few weeks with the second. Doing the rehab diligently. Percutaneous right PRP tenotomy today. Hydrocodone for postprocedural pain. Return to see me in 2 weeks for a postop check.

## 2017-12-20 NOTE — Assessment & Plan Note (Addendum)
After #2 right common extensor tendon origin steroid injections with partial efficacy with the first and only a few weeks with the second. Doing the rehab diligently. Percutaneous right PRP tenotomy today. Hydrocodone for postprocedural pain. Return to see me in 2 weeks for a postop check.

## 2018-01-03 ENCOUNTER — Encounter: Payer: Self-pay | Admitting: Sports Medicine

## 2018-01-03 ENCOUNTER — Ambulatory Visit (INDEPENDENT_AMBULATORY_CARE_PROVIDER_SITE_OTHER): Payer: BLUE CROSS/BLUE SHIELD | Admitting: Sports Medicine

## 2018-01-03 DIAGNOSIS — M7711 Lateral epicondylitis, right elbow: Secondary | ICD-10-CM

## 2018-01-03 MED ORDER — HYDROCODONE-ACETAMINOPHEN 5-325 MG PO TABS: 1.0000 | ORAL_TABLET | Freq: Three times a day (TID) | ORAL | 0 refills | Status: DC | PRN

## 2018-01-03 NOTE — Patient Instructions (Signed)
[facility] [id number], [place], [place] [address] [phone]

## 2018-01-03 NOTE — Progress Notes (Signed)
  Subjective: 2 weeks post PRP percutaneous tenotomy of the right common extensor tendon origin.  Was doing well, tried to catch a plate as it was falling and then had a recurrence of pain but overall improving.  Objective: General: Well-developed, well-nourished, and in no acute distress. Right elbow: No tenderness over the side of the common extensor tendon origin, 2 degrees of extension lag, flexion to almost 90 degrees.  Assessment/plan:   Lateral epicondylitis, right elbow Expected postoperative pain after an aggressive percutaneous tenotomy with PRP 2 weeks ago. Start physical therapy, 2 weeks of gentle protected range of motion, 2 weeks of active range of motion, followed by 4 weeks of strengthening. ___________________________________________ Ihor Austin. Benjamin Stain, M.D., ABFM., CAQSM. Primary Care and Sports Medicine [facility]  Adjunct Instructor of Family Medicine  [employer]

## 2018-01-03 NOTE — Assessment & Plan Note (Signed)
Expected postoperative pain after an aggressive percutaneous tenotomy with PRP 2 weeks ago. Start physical therapy, 2 weeks of gentle protected range of motion, 2 weeks of active range of motion, followed by 4 weeks of strengthening.

## 2018-01-10 DIAGNOSIS — M7711 Lateral epicondylitis, right elbow: Secondary | ICD-10-CM | POA: Diagnosis not present

## 2018-01-18 ENCOUNTER — Telehealth: Payer: Self-pay

## 2018-01-18 DIAGNOSIS — M7711 Lateral epicondylitis, right elbow: Secondary | ICD-10-CM

## 2018-01-18 MED ORDER — HYDROCODONE-ACETAMINOPHEN 5-325 MG PO TABS: 1.0000 | ORAL_TABLET | Freq: Three times a day (TID) | ORAL | 0 refills | Status: DC | PRN

## 2018-01-18 NOTE — Telephone Encounter (Signed)
Pt had PRP and wanting to know if she is okay to go ahead and take Advil or if she should get refill of pain medication Dr T was giving?  Please advise

## 2018-01-18 NOTE — Telephone Encounter (Signed)
I would prefer she avoid anti-inflammatories for now, I am going to refill the pain medication, she can break it in half.

## 2018-01-19 DIAGNOSIS — M7711 Lateral epicondylitis, right elbow: Secondary | ICD-10-CM | POA: Diagnosis not present

## 2018-01-19 NOTE — Telephone Encounter (Signed)
Pt advised.

## 2018-01-31 ENCOUNTER — Ambulatory Visit: Payer: BLUE CROSS/BLUE SHIELD | Admitting: Sports Medicine

## 2018-01-31 ENCOUNTER — Encounter: Payer: Self-pay | Admitting: Sports Medicine

## 2018-01-31 ENCOUNTER — Ambulatory Visit (INDEPENDENT_AMBULATORY_CARE_PROVIDER_SITE_OTHER): Payer: BLUE CROSS/BLUE SHIELD

## 2018-01-31 DIAGNOSIS — M7711 Lateral epicondylitis, right elbow: Secondary | ICD-10-CM

## 2018-01-31 DIAGNOSIS — M25521 Pain in right elbow: Secondary | ICD-10-CM | POA: Diagnosis not present

## 2018-01-31 MED ORDER — MELOXICAM 15 MG PO TABS: ORAL_TABLET | ORAL | 3 refills | Status: DC

## 2018-01-31 NOTE — Assessment & Plan Note (Signed)
PRP percutaneous tenotomy was 6 weeks ago, persistent pain, albeit intermittent. She has been doing her therapy, wearing her tennis elbow brace. I am going to switch from ibuprofen to meloxicam, she can continue to use hydrocodone as needed. X-ray, MRI, and I would like a surgical opinion from Dr. Everardo Pacific.

## 2018-01-31 NOTE — Progress Notes (Signed)
  Subjective:    CC: Right elbow  HPI: Michelle Lewis returns, she is now 6 weeks post aggressive percutaneous tenotomy of the common extensor tendon origin with PRP injection.  She continues to have intermittent pain, no longer consistent.  She has been doing her therapy, she is wearing her tennis elbow brace.  Somewhat frustrated by lack of response.  I reviewed the past medical history, family history, social history, surgical history, and allergies today and no changes were needed.  Please see the problem list section below in epic for further details.  Past Medical History: No past medical history on file. Past Surgical History: No past surgical history on file. Social History: Social History   Socioeconomic History  . Marital status: Married    Spouse name: Not on file  . Number of children: Not on file  . Years of education: Not on file  . Highest education level: Not on file  Occupational History  . Not on file  Social Needs  . Financial resource strain: Not on file  . Food insecurity:    Worry: Not on file    Inability: Not on file  . Transportation needs:    Medical: Not on file    Non-medical: Not on file  Tobacco Use  . Smoking status: Never Smoker  . Smokeless tobacco: Never Used  Substance and Sexual Activity  . Alcohol use: Not on file  . Drug use: Not on file  . Sexual activity: Not on file  Lifestyle  . Physical activity:    Days per week: Not on file    Minutes per session: Not on file  . Stress: Not on file  Relationships  . Social connections:    Talks on phone: Not on file    Gets together: Not on file    Attends religious service: Not on file    Active member of club or organization: Not on file    Attends meetings of clubs or organizations: Not on file    Relationship status: Not on file  Other Topics Concern  . Not on file  Social History Narrative  . Not on file   Family History: No family history on file. Allergies: No Known  Allergies Medications: See med rec.  Review of Systems: No fevers, chills, night sweats, weight loss, chest pain, or shortness of breath.   Objective:    General: Well Developed, well nourished, and in no acute distress.  Neuro: Alert and oriented x3, extra-ocular muscles intact, sensation grossly intact.  HEENT: Normocephalic, atraumatic, pupils equal round reactive to light, neck supple, no masses, no lymphadenopathy, thyroid nonpalpable.  Skin: Warm and dry, no rashes. Cardiac: Regular rate and rhythm, no murmurs rubs or gallops, no lower extremity edema.  Respiratory: Clear to auscultation bilaterally. Not using accessory muscles, speaking in full sentences.  Impression and Recommendations:    Lateral epicondylitis, right elbow PRP percutaneous tenotomy was 6 weeks ago, persistent pain, albeit intermittent. She has been doing her therapy, wearing her tennis elbow brace. I am going to switch from ibuprofen to meloxicam, she can continue to use hydrocodone as needed. X-ray, MRI, and I would like a surgical opinion from Dr. Everardo Pacific. ___________________________________________ Ihor Austin. Benjamin Stain, M.D., ABFM., CAQSM. Primary Care and Sports Medicine [facility]  Adjunct Professor of Family Medicine  [employer]

## 2018-02-01 DIAGNOSIS — R51 Headache: Secondary | ICD-10-CM | POA: Diagnosis not present

## 2018-02-02 ENCOUNTER — Encounter: Payer: Self-pay | Admitting: Sports Medicine

## 2018-02-02 DIAGNOSIS — M85621 Other cyst of bone, right upper arm: Secondary | ICD-10-CM | POA: Diagnosis not present

## 2018-02-02 DIAGNOSIS — S56511A Strain of other extensor muscle, fascia and tendon at forearm level, right arm, initial encounter: Secondary | ICD-10-CM | POA: Diagnosis not present

## 2018-02-02 DIAGNOSIS — M25421 Effusion, right elbow: Secondary | ICD-10-CM | POA: Diagnosis not present

## 2018-02-02 DIAGNOSIS — S53431A Radial collateral ligament sprain of right elbow, initial encounter: Secondary | ICD-10-CM | POA: Diagnosis not present

## 2018-02-02 DIAGNOSIS — S46811A Strain of other muscles, fascia and tendons at shoulder and upper arm level, right arm, initial encounter: Secondary | ICD-10-CM | POA: Diagnosis not present

## 2018-02-03 DIAGNOSIS — M25522 Pain in left elbow: Secondary | ICD-10-CM | POA: Diagnosis not present

## 2018-02-08 ENCOUNTER — Other Ambulatory Visit: Payer: Self-pay | Admitting: Sports Medicine

## 2018-02-08 DIAGNOSIS — M7711 Lateral epicondylitis, right elbow: Secondary | ICD-10-CM

## 2018-02-08 MED ORDER — HYDROCODONE-ACETAMINOPHEN 5-325 MG PO TABS: 1.0000 | ORAL_TABLET | Freq: Three times a day (TID) | ORAL | 0 refills | Status: DC | PRN

## 2018-02-21 DIAGNOSIS — M7711 Lateral epicondylitis, right elbow: Secondary | ICD-10-CM | POA: Diagnosis not present

## 2018-03-01 DIAGNOSIS — M7711 Lateral epicondylitis, right elbow: Secondary | ICD-10-CM | POA: Diagnosis not present

## 2018-03-22 DIAGNOSIS — Z9889 Other specified postprocedural states: Secondary | ICD-10-CM | POA: Diagnosis not present

## 2018-03-22 DIAGNOSIS — M7711 Lateral epicondylitis, right elbow: Secondary | ICD-10-CM | POA: Diagnosis not present

## 2018-04-06 DIAGNOSIS — Z9889 Other specified postprocedural states: Secondary | ICD-10-CM | POA: Diagnosis not present

## 2018-04-06 DIAGNOSIS — M7711 Lateral epicondylitis, right elbow: Secondary | ICD-10-CM | POA: Diagnosis not present

## 2018-04-22 DIAGNOSIS — Z9889 Other specified postprocedural states: Secondary | ICD-10-CM | POA: Diagnosis not present

## 2018-04-22 DIAGNOSIS — M7711 Lateral epicondylitis, right elbow: Secondary | ICD-10-CM | POA: Diagnosis not present

## 2018-05-02 DIAGNOSIS — Z9889 Other specified postprocedural states: Secondary | ICD-10-CM | POA: Diagnosis not present

## 2018-05-02 DIAGNOSIS — M7711 Lateral epicondylitis, right elbow: Secondary | ICD-10-CM | POA: Diagnosis not present

## 2018-05-06 DIAGNOSIS — Z1231 Encounter for screening mammogram for malignant neoplasm of breast: Secondary | ICD-10-CM | POA: Diagnosis not present

## 2018-05-18 DIAGNOSIS — E538 Deficiency of other specified B group vitamins: Secondary | ICD-10-CM | POA: Diagnosis not present

## 2018-05-18 DIAGNOSIS — Z0001 Encounter for general adult medical examination with abnormal findings: Secondary | ICD-10-CM | POA: Diagnosis not present

## 2018-05-18 DIAGNOSIS — E039 Hypothyroidism, unspecified: Secondary | ICD-10-CM | POA: Diagnosis not present

## 2018-05-18 DIAGNOSIS — N951 Menopausal and female climacteric states: Secondary | ICD-10-CM | POA: Diagnosis not present

## 2018-05-18 DIAGNOSIS — E559 Vitamin D deficiency, unspecified: Secondary | ICD-10-CM | POA: Diagnosis not present

## 2018-06-01 DIAGNOSIS — Z9889 Other specified postprocedural states: Secondary | ICD-10-CM | POA: Diagnosis not present

## 2018-06-01 DIAGNOSIS — M7711 Lateral epicondylitis, right elbow: Secondary | ICD-10-CM | POA: Diagnosis not present

## 2018-06-22 ENCOUNTER — Encounter: Payer: Self-pay | Admitting: Sports Medicine

## 2018-06-22 ENCOUNTER — Ambulatory Visit: Payer: BLUE CROSS/BLUE SHIELD | Admitting: Sports Medicine

## 2018-06-22 ENCOUNTER — Other Ambulatory Visit: Payer: Self-pay

## 2018-06-22 DIAGNOSIS — M7521 Bicipital tendinitis, right shoulder: Secondary | ICD-10-CM

## 2018-06-22 DIAGNOSIS — M7711 Lateral epicondylitis, right elbow: Secondary | ICD-10-CM | POA: Diagnosis not present

## 2018-06-22 NOTE — Assessment & Plan Note (Signed)
Good response to common extensor tendon repair, with Dr. Everardo Pacific. She is for the most part asymptomatic right now with regards to her tennis elbow.

## 2018-06-22 NOTE — Assessment & Plan Note (Signed)
Doing well with regards to her common extensor tendon origin and tennis elbow. Pain today is referrable to the distal biceps, adding rehab exercises, injection today with ultrasound guidance. Return to see me in a month.

## 2018-06-22 NOTE — Progress Notes (Signed)
Subjective:    CC:  Right elbow pain  HPI: Michelle Lewis is a pleasant 43 year old female, we treated her extensively for lateral epicondylitis, ending up with PRP injection that failed, she ultimately had operative intervention with Dr. Everardo Pacific back in December, overall did well.  Unfortunately she now has increasing pain in her elbow, and is worried that she has hurt her common extensor tendon again.  Pain is somewhat posterior to the lateral epicondyle.  Worse with flexion and gripping motions.  I reviewed the past medical history, family history, social history, surgical history, and allergies today and no changes were needed.  Please see the problem list section below in epic for further details.  Past Medical History: No past medical history on file. Past Surgical History: No past surgical history on file. Social History: Social History   Socioeconomic History  . Marital status: Married    Spouse name: Not on file  . Number of children: Not on file  . Years of education: Not on file  . Highest education level: Not on file  Occupational History  . Not on file  Social Needs  . Financial resource strain: Not on file  . Food insecurity:    Worry: Not on file    Inability: Not on file  . Transportation needs:    Medical: Not on file    Non-medical: Not on file  Tobacco Use  . Smoking status: Never Smoker  . Smokeless tobacco: Never Used  Substance and Sexual Activity  . Alcohol use: Not on file  . Drug use: Not on file  . Sexual activity: Not on file  Lifestyle  . Physical activity:    Days per week: Not on file    Minutes per session: Not on file  . Stress: Not on file  Relationships  . Social connections:    Talks on phone: Not on file    Gets together: Not on file    Attends religious service: Not on file    Active member of club or organization: Not on file    Attends meetings of clubs or organizations: Not on file    Relationship status: Not on file  Other Topics  Concern  . Not on file  Social History Narrative  . Not on file   Family History: No family history on file. Allergies: No Known Allergies Medications: See med rec.  Review of Systems: No fevers, chills, night sweats, weight loss, chest pain, or shortness of breath.   Objective:    General: Well Developed, well nourished, and in no acute distress.  Neuro: Alert and oriented x3, extra-ocular muscles intact, sensation grossly intact.  HEENT: Normocephalic, atraumatic, pupils equal round reactive to light, neck supple, no masses, no lymphadenopathy, thyroid nonpalpable.  Skin: Warm and dry, no rashes. Cardiac: Regular rate and rhythm, no murmurs rubs or gallops, no lower extremity edema.  Respiratory: Clear to auscultation bilaterally. Not using accessory muscles, speaking in full sentences. Right elbow: Unremarkable to inspection. Range of motion full pronation, supination, flexion, extension. Strength is full to all of the above directions Stable to varus, valgus stress. Negative moving valgus stress test. Specifically no tenderness at the common extensor tendon origin, she did have reproduction of pain with resisted supination of the forearm. Ulnar nerve does not sublux. Negative cubital tunnel Tinel's.  Procedure: Real-time Ultrasound Guided injection of the distal biceps insertion of the right Device: GE Logiq E  Verbal informed consent obtained.  Time-out conducted.  Noted no overlying erythema, induration, or  other signs of local infection.  Skin prepped in a sterile fashion.  Local anesthesia: Topical Ethyl chloride.  With sterile technique and under real time ultrasound guidance:  I placed before of information, I was able to see the radial tuberosity and the biceps tendon distally at its insertion, I guided the needle to the location and injected medication around the distal biceps tendon with a total of 1 cc Kenalog 40, 1 cc lidocaine. Completed without difficulty  Pain  immediately resolved suggesting accurate placement of the medication.  Advised to call if fevers/chills, erythema, induration, drainage, or persistent bleeding.  Images permanently stored and available for review in the ultrasound unit.  Impression: Technically successful ultrasound guided injection.  Impression and Recommendations:    Distal biceps tendinitis on right Doing well with regards to her common extensor tendon origin and tennis elbow. Pain today is referrable to the distal biceps, adding rehab exercises, injection today with ultrasound guidance. Return to see me in a month.  Lateral epicondylitis, right elbow Good response to common extensor tendon repair, with Dr. Everardo Pacific. She is for the most part asymptomatic right now with regards to her tennis elbow.   ___________________________________________ Ihor Austin. Benjamin Stain, M.D., ABFM., CAQSM. Primary Care and Sports Medicine [facility]  Adjunct Professor of Family Medicine  [employer]

## 2018-07-25 ENCOUNTER — Telehealth: Payer: BLUE CROSS/BLUE SHIELD | Admitting: Sports Medicine

## 2018-08-15 IMAGING — DX DG LUMBAR SPINE COMPLETE 4+V
5 series · 5 of 5 positions shown · non-contrast
Comparison: None in PACs

CLINICAL DATA: Right leg pain for the past 7 months with no known
injury. No low back pain

EXAM:
LUMBAR SPINE - COMPLETE 4+ VIEW

[l-spine ap]
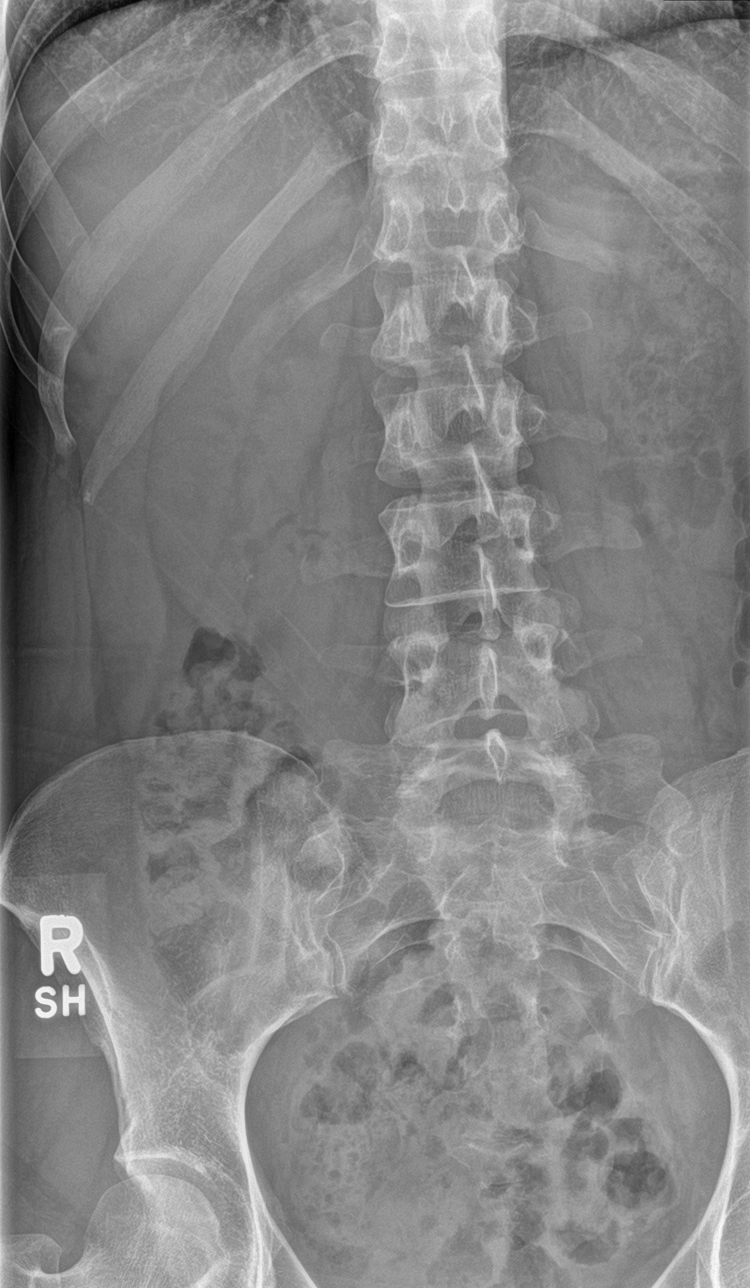

[l-spine obl (1 of 2)]
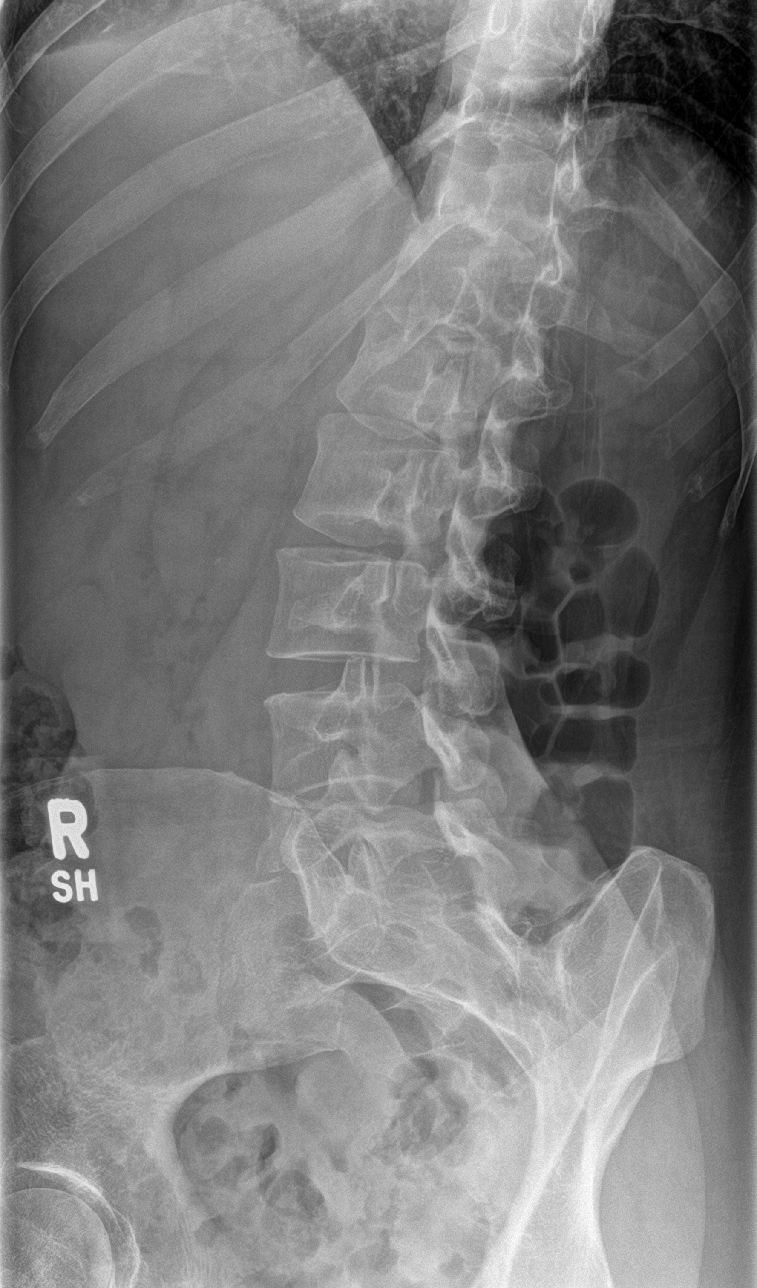

[l-spine obl (2 of 2)]
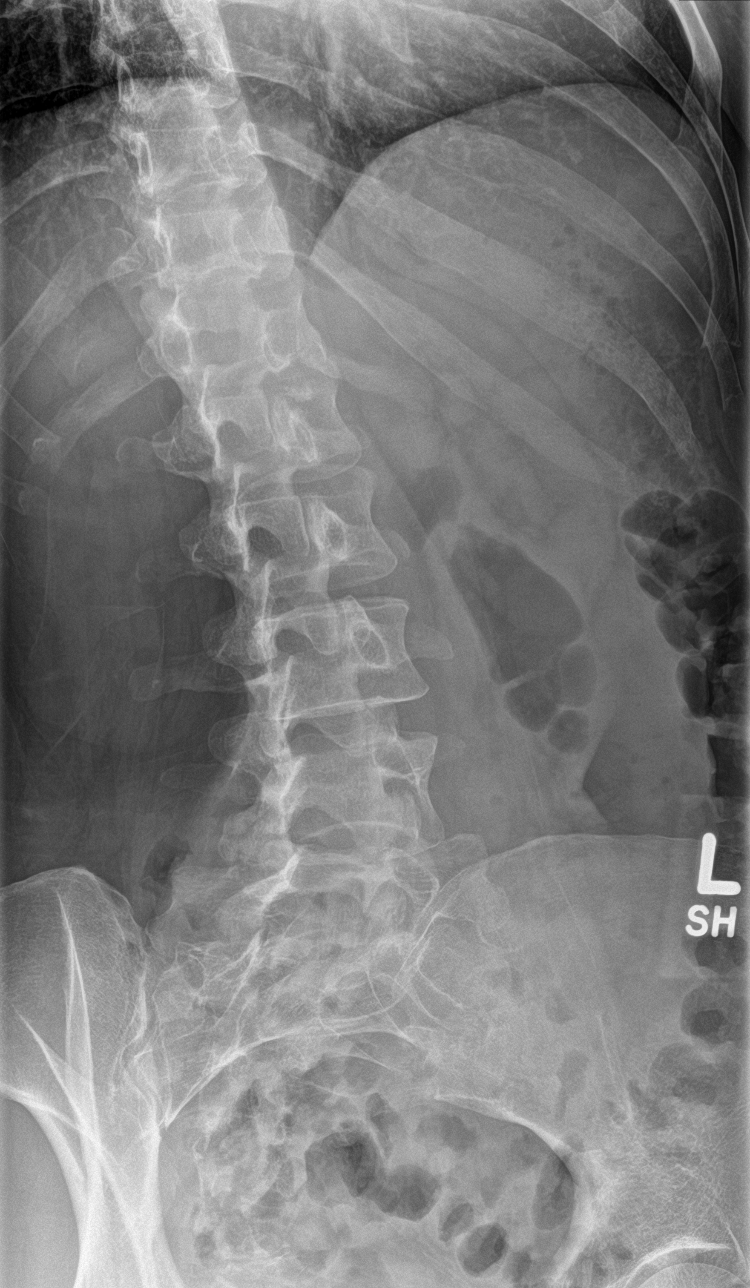

[l-spine lat]
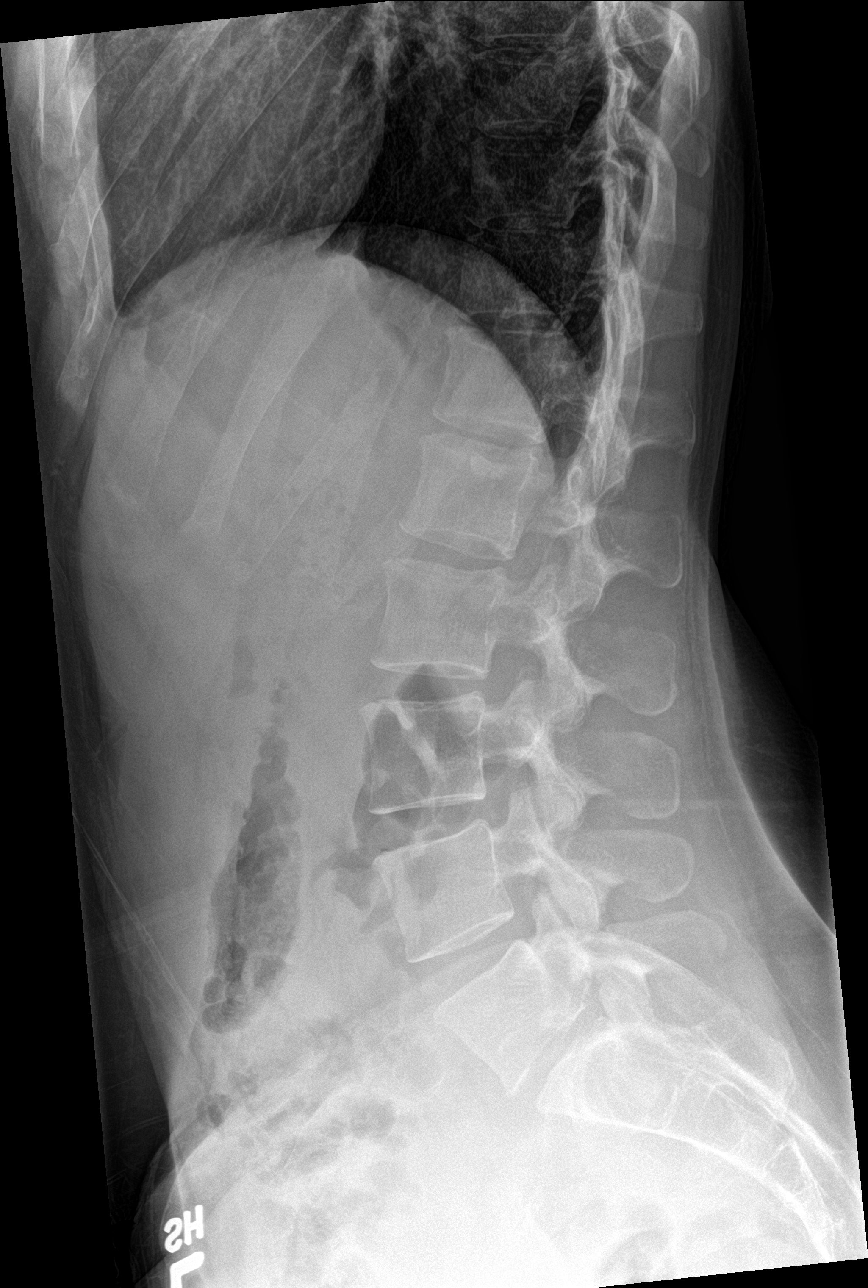

[l-spine spot]
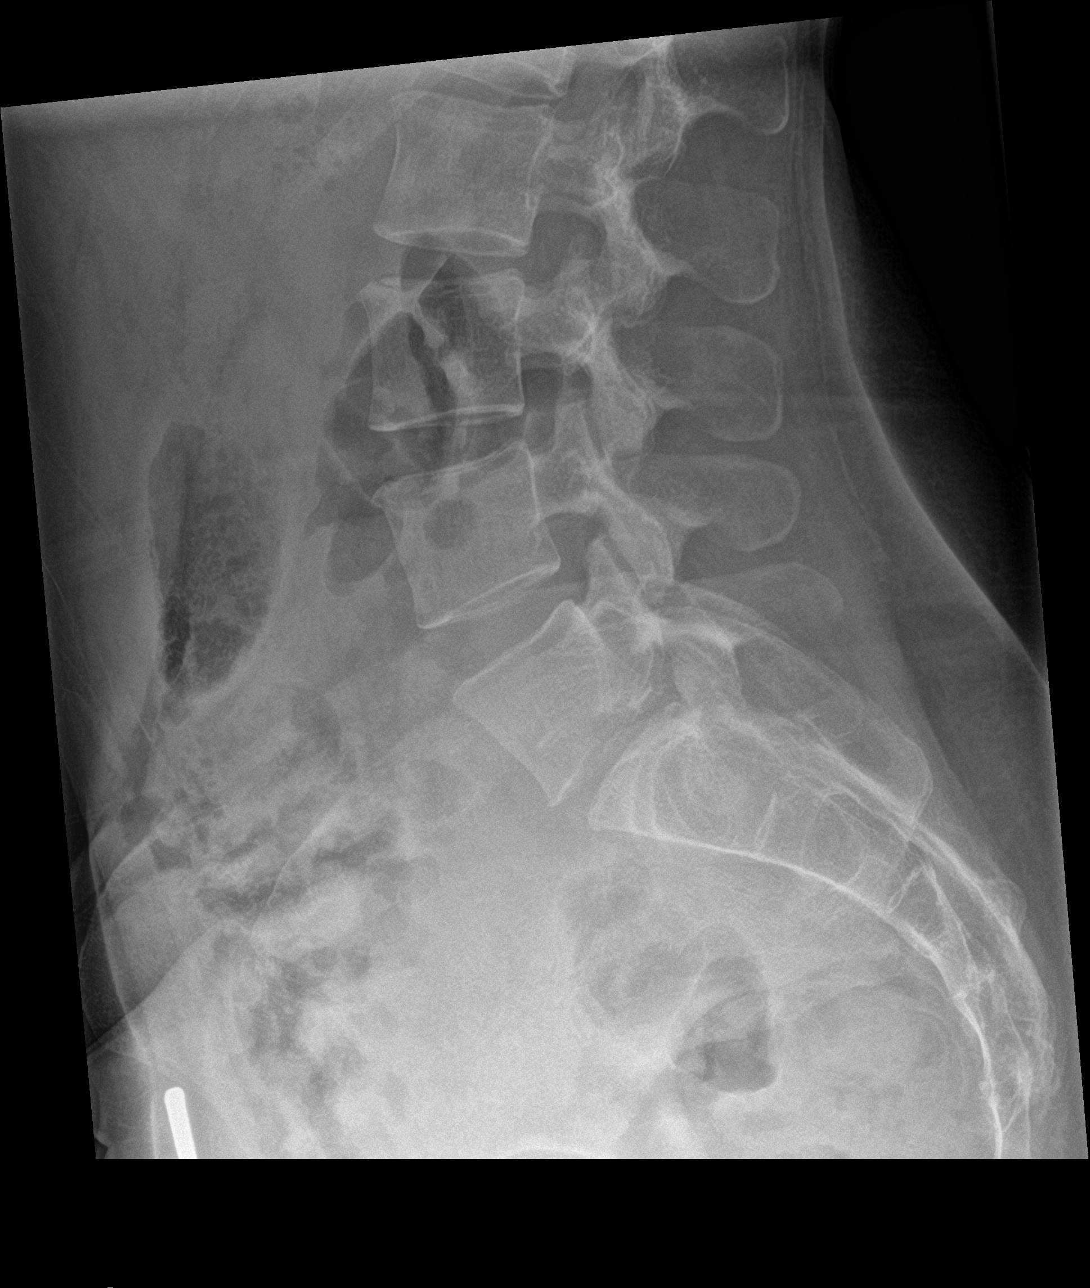

[5 of 5 positions shown; findings below may reference images not displayed]

FINDINGS: Numbering of the vertebral levels assumes at the lower most
vertebral level exhibiting ribs is T12.The lumbar vertebral bodies
are preserved in height. There is a transitional appearance of the
transverse processes at L5 with a pseudarthrosis suspected on the
right. The pedicles and transverse processes of L1 through L4 are
unremarkable. The disc space heights are well maintained. There may
be unilateral or bilateral pars defects at L5 but these are not
clearly evident on the oblique views. There is no spondylolisthesis.
IMPRESSION: No definite acute bony abnormality. Possible bilateral pars defects
at L5. L5 transverse processes appear transitional with a
pseudoarthrosis on the right. Lumbar spine CT scanning would be
useful for further evaluation of the pars at L5.

## 2018-08-30 DIAGNOSIS — R51 Headache: Secondary | ICD-10-CM | POA: Diagnosis not present

## 2018-08-30 DIAGNOSIS — G4700 Insomnia, unspecified: Secondary | ICD-10-CM | POA: Diagnosis not present

## 2018-09-12 DIAGNOSIS — R102 Pelvic and perineal pain: Secondary | ICD-10-CM | POA: Diagnosis not present

## 2018-09-12 DIAGNOSIS — Z6826 Body mass index (BMI) 26.0-26.9, adult: Secondary | ICD-10-CM | POA: Diagnosis not present

## 2018-09-12 DIAGNOSIS — Z01419 Encounter for gynecological examination (general) (routine) without abnormal findings: Secondary | ICD-10-CM | POA: Diagnosis not present

## 2018-09-12 DIAGNOSIS — Z13220 Encounter for screening for lipoid disorders: Secondary | ICD-10-CM | POA: Diagnosis not present

## 2019-02-06 ENCOUNTER — Other Ambulatory Visit: Payer: Self-pay

## 2019-02-06 ENCOUNTER — Ambulatory Visit (INDEPENDENT_AMBULATORY_CARE_PROVIDER_SITE_OTHER): Payer: BC Managed Care – PPO | Admitting: Sports Medicine

## 2019-02-06 ENCOUNTER — Encounter: Payer: Self-pay | Admitting: Sports Medicine

## 2019-02-06 DIAGNOSIS — M2550 Pain in unspecified joint: Secondary | ICD-10-CM | POA: Diagnosis not present

## 2019-02-06 NOTE — Patient Instructions (Signed)
Try anti-inflammatory supplements such as Devil's Claw, turmeric, glucosamine and chondroitin, and even CBD oil.

## 2019-02-06 NOTE — Assessment & Plan Note (Signed)
Arnesia has done well in the past with her tennis elbow surgery as well as a distal biceps injection that I performed at the last visit. She continues to have multiple aches and pains, often in the morning in the bottom of her feet, but not quite at the plantar fascia origin. For the most part I think this is the normal aches and pains of aging and we discussed this in the office. Certainly we could try daily anti-inflammatory but she is looking to avoid a pharmacologic approach. Her full rheumatoid work-up was unremarkable including CBC, ESR, CRP, rheumatoid panel, lupus panel, uric acid, CK levels. She is interested in alternative forms of anti-inflammatory supplements such as Devil's Claw, turmeric, glucosamine and chondroitin, and even CBD oil, all of which I think are acceptable options and she will discuss this with her providers at [facility]. I have also recommended an anti-inflammatory diet high in omega-3's. She can return to see me as needed, I am happy to try meloxicam daily or every other day should the supplements fail.

## 2019-02-06 NOTE — Progress Notes (Signed)
Subjective:    CC: Follow-up  HPI: Lesleyanne returns, we treated her elbow extensively, she had common extensor tendon debridement after failure of PRP, we did a distal biceps injection that worked well, she returns today to talk about widespread aches and pains, mostly in her knees, feet in the mornings, she is mostly curious as to whether there is underlying disease process.  I reviewed the past medical history, family history, social history, surgical history, and allergies today and no changes were needed.  Please see the problem list section below in epic for further details.  Past Medical History: No past medical history on file. Past Surgical History: No past surgical history on file. Social History: Social History   Socioeconomic History  . Marital status: Married    Spouse name: Not on file  . Number of children: Not on file  . Years of education: Not on file  . Highest education level: Not on file  Occupational History  . Not on file  Social Needs  . Financial resource strain: Not on file  . Food insecurity    Worry: Not on file    Inability: Not on file  . Transportation needs    Medical: Not on file    Non-medical: Not on file  Tobacco Use  . Smoking status: Never Smoker  . Smokeless tobacco: Never Used  Substance and Sexual Activity  . Alcohol use: Not on file  . Drug use: Not on file  . Sexual activity: Not on file  Lifestyle  . Physical activity    Days per week: Not on file    Minutes per session: Not on file  . Stress: Not on file  Relationships  . Social Herbalist on phone: Not on file    Gets together: Not on file    Attends religious service: Not on file    Active member of club or organization: Not on file    Attends meetings of clubs or organizations: Not on file    Relationship status: Not on file  Other Topics Concern  . Not on file  Social History Narrative  . Not on file   Family History: No family history on file.  Allergies: No Known Allergies Medications: See med rec.  Review of Systems: No fevers, chills, night sweats, weight loss, chest pain, or shortness of breath.   Objective:    General: Well Developed, well nourished, and in no acute distress.  Neuro: Alert and oriented x3, extra-ocular muscles intact, sensation grossly intact.  HEENT: Normocephalic, atraumatic, pupils equal round reactive to light, neck supple, no masses, no lymphadenopathy, thyroid nonpalpable.  Skin: Warm and dry, no rashes. Cardiac: Regular rate and rhythm, no murmurs rubs or gallops, no lower extremity edema.  Respiratory: Clear to auscultation bilaterally. Not using accessory muscles, speaking in full sentences.  Impression and Recommendations:    Polyarthralgia Brendi has done well in the past with her tennis elbow surgery as well as a distal biceps injection that I performed at the last visit. She continues to have multiple aches and pains, often in the morning in the bottom of her feet, but not quite at the plantar fascia origin. For the most part I think this is the normal aches and pains of aging and we discussed this in the office. Certainly we could try daily anti-inflammatory but she is looking to avoid a pharmacologic approach. Her full rheumatoid work-up was unremarkable including CBC, ESR, CRP, rheumatoid panel, lupus panel, uric acid, CK  levels. She is interested in alternative forms of anti-inflammatory supplements such as Devil's Claw, turmeric, glucosamine and chondroitin, and even CBD oil, all of which I think are acceptable options and she will discuss this with her providers at [facility]. I have also recommended an anti-inflammatory diet high in omega-3's. She can return to see me as needed, I am happy to try meloxicam daily or every other day should the supplements fail.   ___________________________________________ Gwen Her. Dianah Field, M.D., ABFM., CAQSM. Primary Care  and Sports Medicine [facility]  Adjunct Professor of [employer]

## 2019-02-14 DIAGNOSIS — R0989 Other specified symptoms and signs involving the circulatory and respiratory systems: Secondary | ICD-10-CM | POA: Diagnosis not present

## 2019-02-14 DIAGNOSIS — J3489 Other specified disorders of nose and nasal sinuses: Secondary | ICD-10-CM | POA: Diagnosis not present

## 2019-05-03 DIAGNOSIS — R293 Abnormal posture: Secondary | ICD-10-CM | POA: Diagnosis not present

## 2019-05-03 DIAGNOSIS — M9901 Segmental and somatic dysfunction of cervical region: Secondary | ICD-10-CM | POA: Diagnosis not present

## 2019-05-03 DIAGNOSIS — M2560 Stiffness of unspecified joint, not elsewhere classified: Secondary | ICD-10-CM | POA: Diagnosis not present

## 2019-05-03 DIAGNOSIS — M542 Cervicalgia: Secondary | ICD-10-CM | POA: Diagnosis not present

## 2019-05-04 DIAGNOSIS — M2560 Stiffness of unspecified joint, not elsewhere classified: Secondary | ICD-10-CM | POA: Diagnosis not present

## 2019-05-04 DIAGNOSIS — M542 Cervicalgia: Secondary | ICD-10-CM | POA: Diagnosis not present

## 2019-05-04 DIAGNOSIS — M9901 Segmental and somatic dysfunction of cervical region: Secondary | ICD-10-CM | POA: Diagnosis not present

## 2019-05-04 DIAGNOSIS — R293 Abnormal posture: Secondary | ICD-10-CM | POA: Diagnosis not present

## 2019-05-05 DIAGNOSIS — H524 Presbyopia: Secondary | ICD-10-CM | POA: Diagnosis not present

## 2019-05-10 DIAGNOSIS — Z1231 Encounter for screening mammogram for malignant neoplasm of breast: Secondary | ICD-10-CM | POA: Diagnosis not present

## 2019-09-13 ENCOUNTER — Telehealth: Payer: Self-pay

## 2019-09-14 NOTE — Telephone Encounter (Signed)
Ms Flippins concerns about her husband Casimiro Needle Leone's medication have been addressed directly with Mr. Grudzinski.

## 2019-11-01 DIAGNOSIS — Z13220 Encounter for screening for lipoid disorders: Secondary | ICD-10-CM | POA: Diagnosis not present

## 2019-11-10 DIAGNOSIS — M8588 Other specified disorders of bone density and structure, other site: Secondary | ICD-10-CM | POA: Diagnosis not present

## 2019-11-10 DIAGNOSIS — M8589 Other specified disorders of bone density and structure, multiple sites: Secondary | ICD-10-CM | POA: Diagnosis not present

## 2019-11-10 DIAGNOSIS — M85852 Other specified disorders of bone density and structure, left thigh: Secondary | ICD-10-CM | POA: Diagnosis not present

## 2020-01-09 DIAGNOSIS — N63 Unspecified lump in unspecified breast: Secondary | ICD-10-CM | POA: Diagnosis not present

## 2020-01-09 DIAGNOSIS — N644 Mastodynia: Secondary | ICD-10-CM | POA: Diagnosis not present

## 2020-01-09 DIAGNOSIS — Z803 Family history of malignant neoplasm of breast: Secondary | ICD-10-CM | POA: Diagnosis not present

## 2020-01-12 DIAGNOSIS — N6012 Diffuse cystic mastopathy of left breast: Secondary | ICD-10-CM | POA: Diagnosis not present

## 2020-01-12 DIAGNOSIS — Z803 Family history of malignant neoplasm of breast: Secondary | ICD-10-CM | POA: Diagnosis not present

## 2021-07-11 ENCOUNTER — Ambulatory Visit: Payer: BC Managed Care – PPO | Admitting: Sports Medicine

## 2022-09-18 ENCOUNTER — Ambulatory Visit: Payer: Managed Care, Other (non HMO)

## 2022-09-18 ENCOUNTER — Ambulatory Visit: Payer: Managed Care, Other (non HMO) | Admitting: Sports Medicine

## 2022-09-18 DIAGNOSIS — M7541 Impingement syndrome of right shoulder: Secondary | ICD-10-CM

## 2022-09-18 MED ORDER — MELOXICAM 15 MG PO TABS
ORAL_TABLET | ORAL | 3 refills | Status: AC
Start: 2022-09-18 — End: ?

## 2022-09-18 NOTE — Assessment & Plan Note (Signed)
Pleasant 47 year old female, she has had maybe a month of pain right shoulder localized over the deltoid worse with abduction. On exam she does have a positive Neer's sign, other testing is for the most part negative. I explained the anatomy, pathophysiology and force coupling function of the rotator cuff, we will start conservatively, she will switch from ibuprofen to meloxicam, will get baseline x-rays, home physical therapy, return to see me in 6 weeks, injection if not better.

## 2022-09-18 NOTE — Progress Notes (Signed)
    Procedures performed today:    None.  Independent interpretation of notes and tests performed by another provider:   None.  Brief History, Exam, Impression, and Recommendations:    Impingement syndrome, shoulder, right Pleasant 47 year old female, she has had maybe a month of pain right shoulder localized over the deltoid worse with abduction. On exam she does have a positive Neer's sign, other testing is for the most part negative. I explained the anatomy, pathophysiology and force coupling function of the rotator cuff, we will start conservatively, she will switch from ibuprofen to meloxicam, will get baseline x-rays, home physical therapy, return to see me in 6 weeks, injection if not better.    ____________________________________________ Ihor Austin. Benjamin Stain, M.D., ABFM., CAQSM., AME. Primary Care and Sports Medicine [facility]  Adjunct Professor of Family Medicine  [place] [employer] [facility] [employer]  Restaurant manager, [employer]

## 2023-11-25 ENCOUNTER — Encounter: Payer: Self-pay | Admitting: Sports Medicine
